# Patient Record
Sex: Female | Born: 1955 | Race: Black or African American | Hispanic: No | Marital: Married | Smoking: Former smoker
Health system: Southern US, Community
[De-identification: ages and names within clinical notes are randomized; demographics above are authoritative.]

## PROBLEM LIST (undated history)

## (undated) DIAGNOSIS — R531 Weakness: Secondary | ICD-10-CM

## (undated) DIAGNOSIS — M3501 Sicca syndrome with keratoconjunctivitis: Secondary | ICD-10-CM

## (undated) DIAGNOSIS — G8929 Other chronic pain: Secondary | ICD-10-CM

## (undated) DIAGNOSIS — E1121 Type 2 diabetes mellitus with diabetic nephropathy: Secondary | ICD-10-CM

## (undated) DIAGNOSIS — Z8669 Personal history of other diseases of the nervous system and sense organs: Secondary | ICD-10-CM

## (undated) DIAGNOSIS — M179 Osteoarthritis of knee, unspecified: Secondary | ICD-10-CM

## (undated) DIAGNOSIS — F329 Major depressive disorder, single episode, unspecified: Secondary | ICD-10-CM

## (undated) DIAGNOSIS — H169 Unspecified keratitis: Secondary | ICD-10-CM

## (undated) DIAGNOSIS — E7800 Pure hypercholesterolemia, unspecified: Secondary | ICD-10-CM

## (undated) DIAGNOSIS — I10 Essential (primary) hypertension: Secondary | ICD-10-CM

## (undated) DIAGNOSIS — E559 Vitamin D deficiency, unspecified: Secondary | ICD-10-CM

## (undated) DIAGNOSIS — M4722 Other spondylosis with radiculopathy, cervical region: Secondary | ICD-10-CM

## (undated) DIAGNOSIS — M1710 Unilateral primary osteoarthritis, unspecified knee: Secondary | ICD-10-CM

## (undated) DIAGNOSIS — H109 Unspecified conjunctivitis: Secondary | ICD-10-CM

## (undated) DIAGNOSIS — J320 Chronic maxillary sinusitis: Secondary | ICD-10-CM

## (undated) DIAGNOSIS — K219 Gastro-esophageal reflux disease without esophagitis: Secondary | ICD-10-CM

## (undated) DIAGNOSIS — M797 Fibromyalgia: Secondary | ICD-10-CM

## (undated) DIAGNOSIS — J309 Allergic rhinitis, unspecified: Secondary | ICD-10-CM

## (undated) HISTORY — DX: Pure hypercholesterolemia, unspecified: E78.00

## (undated) HISTORY — DX: Weakness: R53.1

## (undated) HISTORY — DX: Unspecified keratitis: H16.9

## (undated) HISTORY — DX: Other spondylosis with radiculopathy, cervical region: M47.22

## (undated) HISTORY — DX: Fibromyalgia: M79.7

## (undated) HISTORY — DX: Gastro-esophageal reflux disease without esophagitis: K21.9

## (undated) HISTORY — DX: Type 2 diabetes mellitus with diabetic nephropathy: E11.21

## (undated) HISTORY — DX: Osteoarthritis of knee, unspecified: M17.9

## (undated) HISTORY — DX: Other chronic pain: G89.29

## (undated) HISTORY — PX: CYST REMOVAL HAND: SHX6279

## (undated) HISTORY — DX: Allergic rhinitis, unspecified: J30.9

## (undated) HISTORY — DX: Sjogren syndrome with keratoconjunctivitis: M35.01

## (undated) HISTORY — PX: TUBAL LIGATION: SHX77

## (undated) HISTORY — DX: Vitamin D deficiency, unspecified: E55.9

## (undated) HISTORY — DX: Personal history of other diseases of the nervous system and sense organs: Z86.69

## (undated) HISTORY — DX: Unilateral primary osteoarthritis, unspecified knee: M17.10

## (undated) HISTORY — DX: Chronic maxillary sinusitis: J32.0

## (undated) HISTORY — DX: Morbid (severe) obesity due to excess calories: E66.01

## (undated) HISTORY — DX: Major depressive disorder, single episode, unspecified: F32.9

## (undated) HISTORY — DX: Unspecified conjunctivitis: H10.9

## (undated) HISTORY — DX: Essential (primary) hypertension: I10

---

## 2015-03-29 ENCOUNTER — Ambulatory Visit (HOSPITAL_BASED_OUTPATIENT_CLINIC_OR_DEPARTMENT_OTHER): Payer: Medicare (Managed Care) | Attending: Family Medicine

## 2015-03-29 VITALS — Ht 66.0 in | Wt 238.0 lb

## 2015-03-29 DIAGNOSIS — G4733 Obstructive sleep apnea (adult) (pediatric): Secondary | ICD-10-CM | POA: Diagnosis present

## 2015-04-06 DIAGNOSIS — G4733 Obstructive sleep apnea (adult) (pediatric): Secondary | ICD-10-CM | POA: Diagnosis not present

## 2015-04-06 NOTE — Sleep Study (Signed)
   NAME: Samantha Levine DATE OF BIRTH:  1956/01/22 MEDICAL RECORD NUMBER [id number]  LOCATION: [facility]  PHYSICIAN: YOUNG,CLINTON D  DATE OF STUDY: 03/29/2015  SLEEP STUDY TYPE: Nocturnal Polysomnogram               REFERRING PHYSICIAN: Jethro BastosKoehler, Robert N, MD  INDICATION FOR STUDY: Hypersomnia with sleep apnea-CPAP titration  EPWORTH SLEEPINESS SCORE:   15/24 HEIGHT: 5\' 6"  (167.6 cm)  WEIGHT: 238 lb (107.956 kg)    Body mass index is 38.43 kg/(m^2).  NECK SIZE: 17 in.  MEDICATIONS: Charted for review  SLEEP ARCHITECTURE: Total sleep time 348 minutes with sleep efficiency 91.5%. Stage I was 2.3%, stage II 75%, stage III absent, REM 22.7% of total sleep time. Sleep latency 4.5 minutes, REM latency 98.5 minutes, awake after sleep onset 27.5 minutes, arousal index 1.9, bedtime medication: Metformin, aspirin, alprazolam  RESPIRATORY DATA: CPAP titration protocol. CPAP was titrated to 9 CWP, AHI 0 per hour, based on snoring, respiratory events and arousals. She wore a small fullface mask.  OXYGEN DATA: Mild to moderate snoring at the beginning of this study was prevented at final CPAP pressure with mean oxygen saturation 91.2% on room air  CARDIAC DATA: Normal sinus rhythm  MOVEMENT/PARASOMNIA: No significant movement disturbance, no bathroom trips  IMPRESSION/ RECOMMENDATION:   1) CPAP titration to 9 CWP, AHI 0 per hour, based on snoring, respiratory events and arousals. She wore a small F&P Simplus fullface mask with heated humidifier. Snoring was prevented and mean oxygen saturation was 91.2% on room air 2) Results of a baseline polysomnogram are not available.   Waymon BudgeYOUNG,CLINTON D Diplomate, American Board of Sleep Medicine  ELECTRONICALLY SIGNED ON:  04/06/2015, 3:15 PM [facility] PH: [phone]   FX: [phone] ACCREDITED BY THE AMERICAN ACADEMY OF SLEEP MEDICINE

## 2015-04-17 ENCOUNTER — Ambulatory Visit (HOSPITAL_BASED_OUTPATIENT_CLINIC_OR_DEPARTMENT_OTHER): Payer: Medicare (Managed Care)

## 2015-04-24 ENCOUNTER — Encounter (HOSPITAL_BASED_OUTPATIENT_CLINIC_OR_DEPARTMENT_OTHER): Payer: Medicare (Managed Care)

## 2015-05-13 ENCOUNTER — Ambulatory Visit (HOSPITAL_BASED_OUTPATIENT_CLINIC_OR_DEPARTMENT_OTHER): Payer: Medicare (Managed Care) | Attending: Family Medicine | Admitting: Radiology

## 2015-05-13 VITALS — Ht 66.0 in | Wt 240.0 lb

## 2015-05-13 DIAGNOSIS — G4719 Other hypersomnia: Secondary | ICD-10-CM

## 2015-05-13 DIAGNOSIS — R5383 Other fatigue: Secondary | ICD-10-CM

## 2015-05-13 DIAGNOSIS — F32A Depression, unspecified: Secondary | ICD-10-CM

## 2015-05-13 DIAGNOSIS — F329 Major depressive disorder, single episode, unspecified: Secondary | ICD-10-CM

## 2015-05-24 DIAGNOSIS — R5383 Other fatigue: Secondary | ICD-10-CM | POA: Diagnosis not present

## 2015-05-24 DIAGNOSIS — G4719 Other hypersomnia: Secondary | ICD-10-CM | POA: Diagnosis not present

## 2015-05-24 NOTE — Progress Notes (Signed)
   NAME: Samantha Levine DATE OF BIRTH:  08-12-1956 MEDICAL RECORD NUMBER [id number]  LOCATION: [facility]  PHYSICIAN: [place] Frieden D  DATE OF STUDY: 05/13/2015  SLEEP STUDY TYPE: Nocturnal Polysomnogram               REFERRING PHYSICIAN: Jethro BastosKoehler, Robert N, MD  INDICATION FOR STUDY: Hypersomnia with sleep apnea  EPWORTH SLEEPINESS SCORE:   15/24   HEIGHT: 5\' 6"  (167.6 cm)  WEIGHT: 240 lb (108.863 kg)    Body mass index is 38.76 kg/(m^2).  NECK SIZE: 17 in.  MEDICATIONS: Charted for review  SLEEP ARCHITECTURE: Total sleep time 296.5 minutes with sleep efficiency 80.5%. Stage I was 12.6%, stage II 60.5%, stage III absent, REM 26.8% of total sleep time. Sleep latency 0.0 minutes, REM latency 60.5 minutes, awake after sleep onset 72 minutes, arousal index 14.8, bedtime medication: None  RESPIRATORY DATA: Apnea hypopnea index (AHI) 2.2 per hour. 11 events were scored including one obstructive apnea and 10 hypopneas. All events were while supine. REM AHI 3.8 per hour.  OXYGEN DATA: Very loud snoring with oxygen desaturation to a nadir of 83% and mean saturation 92.6% on room air  CARDIAC DATA: Sinus rhythm with dropped beats, probably blocked PACs.  MOVEMENT/PARASOMNIA: No significant movement disturbance, no bathroom trips  IMPRESSION/ RECOMMENDATION:   1) Occasional respiratory event with sleep disturbance, within normal limits. AHI 2.2 per hour with all events recorded while sleeping supine. REM AHI 3.8 per hour. Very loud snoring with oxygen desaturation to a nadir of 83% and mean saturation 92.6% on room air.  2) Sleep architecture did not seem to explain a persistent complaint of daytime sleepiness but should be compared with patient's usual sleep habit. For this study she was asleep by 10 PM and awake for an hour between 3 AM and 4 AM REM sleep developed earlier than usual and representative somewhat higher percentage of total sleep time and usually seen. This  pattern can occasionally be associated with primary disorders of excessive somnolence such as narcolepsy. If inappropriate daytime sleepiness remains a significant concern, then consider scheduling a Multiple Sleep Latency Test as a daytime study for evaluation of hypersomnolence.  Waymon BudgeYOUNG,Arantxa Piercey D Diplomate, American Board of Sleep Medicine  ELECTRONICALLY SIGNED ON:  05/24/2015, 3:58 PM [facility] PH: [phone]   FX: [phone] ACCREDITED BY THE AMERICAN ACADEMY OF SLEEP MEDICINE

## 2016-02-04 ENCOUNTER — Encounter: Payer: Self-pay | Admitting: Physician Assistant

## 2016-09-22 ENCOUNTER — Encounter: Payer: Self-pay | Admitting: Physician Assistant

## 2016-09-28 ENCOUNTER — Ambulatory Visit: Payer: Medicare (Managed Care) | Admitting: Physician Assistant

## 2016-09-28 NOTE — Progress Notes (Signed)
Cardiology Office Note    Date:  09/29/2016   ID:  Samantha PeanKimberly Shull, DOB 03/17/1956, MRN [id number]  PCP:  Thane EduKOEHLER,ROBERT NICHOLAS, MD  Cardiologist:  New   Chief Complaint: Cardiac Clearance for left maxillary sinus surgery  History of Present Illness:   Samantha Levine is a 60 y.o. female HTN, HLD, DM with peripheral neuropathy, GERD, fibromyalgia, depression, OSA not on CPAP and chronic left maxillary sinusitis possibly associated with dental abscess who was evaluated by ENT and plan for inpatinet surgery due to OSA requiring CPAP thus presented for cardiac clearance.   Hx of prior abnormal stress test about 20 years ago. F/u cath was normal. Echo about 2-3 years ago was normal with EF of 60% per patient. No records available for review.  The patient had chronic abdominal pain. Had EGD and colonoscopy about 3 months ago which showed diverticulosis. Adjusted diet and s/s improved.   Lab work 02/04/2016 from PCP:  HGb 13.5, SCr 0.74, Na 142, K 4.1, AST 12, ALT 12 LDL 126, HLD 53, Triglyceride 170,  Hemoglobin A1c 7.1 TSH 0.588  Here today for cardiac clearance. She states that she has OSA however never placed on CPAP. She complains of intermittent chest tightness with and without exertional. Also has chronic orthopnea and PND. She uses approximately 5-7 pillows at night. Recently noted LE edema. Denies abdominal tightness, syncope, dizziness. She been not checking her BP at home. Remote hx of tobacco smoking during college years. No recent travel.   Past Medical History:  Diagnosis Date  . Allergic rhinitis   . Cervical spondylosis with radiculopathy   . Chronic pain   . Conjunctivitis   . Fibromyalgia   . GERD (gastroesophageal reflux disease)   . H/O peripheral neuropathy   . Hypercholesterolemia   . Hypertension   . Keratitis sicca   . Left maxillary sinusitis    RELATED TO DENTAL ISSUES  . Left-sided weakness    POSSIBLE RELATED TO CONVERSION DISORDER  . Major  depression    RECURRENT AND SEVERE WITH GRIEF REACTION AND EPISODES OF CONVERSION REACTION MANIFESTED BY LEFT-SIDED WEAKNESS AND LOSS OF SENSATION  . Morbid obesity (HCC)   . Osteoarthritis of knee    SPINE, ANKLES AND FEET  . Type 2 diabetes mellitus with diabetic nephropathy (HCC)   . Vitamin D deficiency     Past Surgical History:  Procedure Laterality Date  . CESAREAN SECTION    . CYST REMOVAL HAND     x2  . TUBAL LIGATION      Current Medications:  Prior to Admission medications   Medication Sig Start Date End Date Taking? Authorizing Provider  acetaminophen (TYLENOL) 500 MG tablet Take 1,000 mg by mouth 2 (two) times daily as needed.   Yes Historical Provider, MD  albuterol (PROVENTIL HFA;VENTOLIN HFA) 108 (90 Base) MCG/ACT inhaler Inhale 2 puffs into the lungs 4 (four) times daily as needed for wheezing or shortness of breath.   Yes Historical Provider, MD  ALPRAZolam Prudy Feeler(XANAX) 0.5 MG tablet Take 0.5 mg by mouth 2 (two) times daily as needed for anxiety.   Yes Historical Provider, MD  amitriptyline (ELAVIL) 25 MG tablet Take 75 mg by mouth at bedtime.   Yes Historical Provider, MD  amLODipine (NORVASC) 10 MG tablet Take 10 mg by mouth daily.   Yes Historical Provider, MD  aspirin EC 81 MG tablet Take 81 mg by mouth daily.   Yes Historical Provider, MD  atorvastatin (LIPITOR) 40 MG tablet Take 40 mg by  mouth daily at 6 PM.   Yes Historical Provider, MD  Cholecalciferol (VITAMIN D3) 1000 units CAPS Take 1 capsule by mouth daily.   Yes Historical Provider, MD  cycloSPORINE (RESTASIS) 0.05 % ophthalmic emulsion Place 1 drop into both eyes daily. DRY EYES   Yes Historical Provider, MD  fluticasone (FLONASE) 50 MCG/ACT nasal spray Place 1 spray into both nostrils daily.   Yes Historical Provider, MD  hydrochlorothiazide (HYDRODIURIL) 25 MG tablet Take 25 mg by mouth 2 (two) times daily as needed.   Yes Historical Provider, MD  Hypromellose (ARTIFICIAL TEARS OP) Apply 1 drop to eye  every 2 (two) hours as needed. FOR DRY EYES   Yes Historical Provider, MD  ibuprofen (ADVIL,MOTRIN) 800 MG tablet Take 800 mg by mouth 2 (two) times daily.   Yes Historical Provider, MD  losartan (COZAAR) 50 MG tablet Take 50 mg by mouth daily.   Yes Historical Provider, MD  metFORMIN (GLUCOPHAGE) 500 MG tablet Take by mouth 2 (two) times daily with a meal.   Yes Historical Provider, MD  Olopatadine HCl (PATADAY) 0.2 % SOLN Apply 1 drop to eye daily as needed.   Yes Historical Provider, MD  pantoprazole (PROTONIX) 40 MG tablet Take 40 mg by mouth 2 (two) times daily.   Yes Historical Provider, MD  polyethylene glycol powder (MIRALAX) powder Take 1 Container by mouth daily.   Yes Historical Provider, MD     Allergies:   Tramadol; Augmentin [amoxicillin-pot clavulanate]; Codeine; Doxycycline; Lisinopril; and Darvon [propoxyphene]   Social History   Social History  . Marital status: Married    Spouse name: N/A  . Number of children: 5  . Years of education: COLLEGE   Occupational History  . DISABLED    Social History Main Topics  . Smoking status: Former Games developermoker  . Smokeless tobacco: Never Used  . Alcohol use No  . Drug use: No  . Sexual activity: Not Asked   Other Topics Concern  . None   Social History Narrative  . None     Family History:  The patient's family history includes Congestive Heart Failure in her brother and father.  ROS:   Please see the history of present illness.    ROS All other systems reviewed and are negative.   PHYSICAL EXAM:   VS:  BP (!) 142/90 (BP Location: Right Arm, Patient Position: Sitting, Cuff Size: Large)   Pulse 62   Ht 5\' 6"  (1.676 m)   Wt 243 lb 6.4 oz (110.4 kg)   SpO2 98%   BMI 39.29 kg/m    GEN: Well nourished, well developed, in no acute distress  HEENT: normal  Neck: + JVD, no carotid bruits, or masses Cardiac: RRR; no murmurs, rubs, or gallops 2+ BL LE  edema  Respiratory:  clear to auscultation bilaterally, normal work of  breathing GI: soft, nontender, nondistended, + BS MS: no deformity or atrophy  Skin: warm and dry, no rash Neuro:  Alert and Oriented x 3, Strength and sensation are intact Psych: Depressed , full affect  Wt Readings from Last 3 Encounters:  09/29/16 243 lb 6.4 oz (110.4 kg)  05/13/15 240 lb (108.9 kg)  03/29/15 238 lb (108 kg)      Studies/Labs Reviewed:   EKG:  EKG is ordered today.  The ekg ordered today demonstrates  NSR at rate of 52 bpm, chronic RBBB  Recent Labs: No results found for requested labs within last 8760 hours.   Lipid Panel No results found  for: CHOL, TRIG, HDL, CHOLHDL, VLDL, LDLCALC, LDLDIRECT  Additional studies/ records that were reviewed today include:   AS above    ASSESSMENT & PLAN:    1. Cardiac clearance - She has chronic intermittent chest tightness and dyspnea. Her cardiac risk factors includes HTN, HLD, DM, untreated OSA and prior false positive stress test (result unavailable). She has evidence of volume overload on exam. Discussed with DOD (Dr. Ladona Ridgel) and recommended BNP, echo and Stress test. F/u with Dr. Tenny Craw in 3 months. Sooner with APP if abnormal study. Continue ASA.   2. OSA - She said that she never placed on CPAP. Her orthopnea/ PND could be due to this. Will defer to primary.   3. LE edema - Can not r/u if due to CHF. Will get BNP and echo. May consider discontinuation of amlodipine in future if no improvement.   4. HTN - Minimally elevated. Continue Amlodipine 10mg  qd and Losartan 50mg  qd. She takes HTCZ 50mg  PRN for dyspnea and LE edema. Likely put on daily dose pending BNP result.   5. HLD - LDL 126. Continue ASA. Managed by PCP.   6. Sinus bradycardia - Not on any AV blocking agent. No dizziness or orthopnea.     Medication Adjustments/Labs and Tests Ordered: Current medicines are reviewed at length with the patient today.  Concerns regarding medicines are outlined above.  Medication changes, Labs and Tests ordered  today are listed in the Patient Instructions below. There are no Patient Instructions on file for this visit.   Lorelei Pont, [place]  09/29/2016 10:22 AM    [facility] [address] [place], [place], [place]  [address] Phone: [phone]; Fax: [phone]

## 2016-09-29 ENCOUNTER — Encounter: Payer: Self-pay | Admitting: Physician Assistant

## 2016-09-29 ENCOUNTER — Ambulatory Visit (INDEPENDENT_AMBULATORY_CARE_PROVIDER_SITE_OTHER): Payer: Medicare (Managed Care) | Admitting: Physician Assistant

## 2016-09-29 VITALS — BP 142/90 | HR 62 | Ht 66.0 in | Wt 243.4 lb

## 2016-09-29 DIAGNOSIS — G4733 Obstructive sleep apnea (adult) (pediatric): Secondary | ICD-10-CM

## 2016-09-29 DIAGNOSIS — I10 Essential (primary) hypertension: Secondary | ICD-10-CM | POA: Diagnosis not present

## 2016-09-29 DIAGNOSIS — R001 Bradycardia, unspecified: Secondary | ICD-10-CM

## 2016-09-29 DIAGNOSIS — R079 Chest pain, unspecified: Secondary | ICD-10-CM | POA: Diagnosis not present

## 2016-09-29 DIAGNOSIS — Z01818 Encounter for other preprocedural examination: Secondary | ICD-10-CM | POA: Diagnosis not present

## 2016-09-29 DIAGNOSIS — R0600 Dyspnea, unspecified: Secondary | ICD-10-CM

## 2016-09-29 DIAGNOSIS — E785 Hyperlipidemia, unspecified: Secondary | ICD-10-CM

## 2016-09-29 LAB — BRAIN NATRIURETIC PEPTIDE: BRAIN NATRIURETIC PEPTIDE: 48.3 pg/mL (ref <100)

## 2016-09-29 NOTE — Patient Instructions (Signed)
Medication Instructions:  Your physician recommends that you continue on your current medications as directed. Please refer to the Current Medication list given to you today.  Labwork: Today: BNP  Testing/Procedures: Your physician has requested that you have a lexiscan myoview. For further information please visit [email or web address] Please follow instruction sheet, as given.  Your physician has requested that you have an echocardiogram. Echocardiography is a painless test that uses sound waves to create images of your heart. It provides your doctor with information about the size and shape of your heart and how well your heart's chambers and valves are working. This procedure takes approximately one hour. There are no restrictions for this procedure.  Follow-Up: Your physician recommends that you schedule an appointment to establish with Dr. Tenny Craw in: 3 months.  (this may change depending on results of testing)  If you need a refill on your cardiac medications before your next appointment, please call your pharmacy.  Thank you for choosing [facility]!!     Any Other Special Instructions Will Be Listed Below (If Applicable).  Echocardiogram An echocardiogram, or echocardiography, uses sound waves (ultrasound) to produce an image of your heart. The echocardiogram is simple, painless, obtained within a short period of time, and offers valuable information to your health care provider. The images from an echocardiogram can provide information such as:  Evidence of coronary artery disease (CAD).  Heart size.  Heart muscle function.  Heart valve function.  Aneurysm detection.  Evidence of a past heart attack.  Fluid buildup around the heart.  Heart muscle thickening.  Assess heart valve function. LET [facility] CARE PROVIDER KNOW ABOUT:  Any allergies you have.  All medicines you are taking, including vitamins, herbs, eye drops, creams, and over-the-counter  medicines.  Previous problems you or members of your family have had with the use of anesthetics.  Any blood disorders you have.  Previous surgeries you have had.  Medical conditions you have.  Possibility of pregnancy, if this applies. BEFORE THE PROCEDURE  No special preparation is needed. Eat and drink normally.  PROCEDURE   In order to produce an image of your heart, gel will be applied to your chest and a wand-like tool (transducer) will be moved over your chest. The gel will help transmit the sound waves from the transducer. The sound waves will harmlessly bounce off your heart to allow the heart images to be captured in real-time motion. These images will then be recorded.  You may need an IV to receive a medicine that improves the quality of the pictures. AFTER THE PROCEDURE You may return to your normal schedule including diet, activities, and medicines, unless your health care provider tells you otherwise.   This information is not intended to replace advice given to you by your health care provider. Make sure you discuss any questions you have with your health care provider.   Document Released: 11/06/2000 Document Revised: 11/30/2014 Document Reviewed: 07/17/2013 Elsevier Interactive Patient Education 2016 Elsevier Inc.    Pharmacologic Stress Electrocardiogram A pharmacologic stress electrocardiogram is a heart (cardiac) test that uses nuclear imaging to evaluate the blood supply to your heart. This test may also be called a pharmacologic stress electrocardiography. Pharmacologic means that a medicine is used to increase your heart rate and blood pressure.  This stress test is done to find areas of poor blood flow to the heart by determining the extent of coronary artery disease (CAD). Some people exercise on a treadmill, which naturally increases the blood flow  to the heart. For those people unable to exercise on a treadmill, a medicine is used. This medicine stimulates  your heart and will cause your heart to beat harder and more quickly, as if you were exercising.  Pharmacologic stress tests can help determine:  The adequacy of blood flow to your heart during increased levels of activity in order to clear you for discharge home.  The extent of coronary artery blockage caused by CAD.  Your prognosis if you have suffered a heart attack.  The effectiveness of cardiac procedures done, such as an angioplasty, which can increase the circulation in your coronary arteries.  Causes of chest pain or pressure. LET [facility] PROVIDER KNOW ABOUT:  Any allergies you have.  All medicines you are taking, including vitamins, herbs, eye drops, creams, and over-the-counter medicines.  Previous problems you or members of your family have had with the use of anesthetics.  Any blood disorders you have.  Previous surgeries you have had.  Medical conditions you have.  Possibility of pregnancy, if this applies.  If you are currently breastfeeding. RISKS AND COMPLICATIONS Generally, this is a safe procedure. However, as with any procedure, complications can occur. Possible complications include:  You develop pain or pressure in the following areas:  Chest.  Jaw or neck.  Between your shoulder blades.  Radiating down your left arm.  Headache.  Dizziness or light-headedness.  Shortness of breath.  Increased or irregular heartbeat.  Low blood pressure.  Nausea or vomiting.  Flushing.  Redness going up the arm and slight pain during injection of medicine.  Heart attack (rare). BEFORE THE PROCEDURE   Avoid all forms of caffeine for 24 hours before your test or as directed by your health care provider. This includes coffee, tea (even decaffeinated tea), caffeinated sodas, chocolate, cocoa, and certain pain medicines.  Follow your health care provider's instructions regarding eating and drinking before the test.  Take your medicines as  directed at regular times with water unless instructed otherwise. Exceptions may include:  If you have diabetes, ask how you are to take your insulin or pills. It is common to adjust insulin dosing the morning of the test.  If you are taking beta-blocker medicines, it is important to talk to your health care provider about these medicines well before the date of your test. Taking beta-blocker medicines may interfere with the test. In some cases, these medicines need to be changed or stopped 24 hours or more before the test.  If you wear a nitroglycerin patch, it may need to be removed prior to the test. Ask your health care provider if the patch should be removed before the test.  If you use an inhaler for any breathing condition, bring it with you to the test.  If you are an outpatient, bring a snack so you can eat right after the stress phase of the test.  Do not smoke for 4 hours prior to the test or as directed by your health care provider.  Do not apply lotions, powders, creams, or oils on your chest prior to the test.  Wear comfortable shoes and clothing. Let your health care provider know if you were unable to complete or follow the preparations for your test. PROCEDURE   Multiple patches (electrodes) will be put on your chest. If needed, small areas of your chest may be shaved to get better contact with the electrodes. Once the electrodes are attached to your body, multiple wires will be attached to the  electrodes, and your heart rate will be monitored.  An IV access will be started. A nuclear trace (isotope) is given. The isotope may be given intravenously, or it may be swallowed. Nuclear refers to several types of radioactive isotopes, and the nuclear isotope lights up the arteries so that the nuclear images are clear. The isotope is absorbed by your body. This results in low radiation exposure.  A resting nuclear image is taken to show how your heart functions at rest.  A  medicine is given through the IV access.  A second scan is done about 1 hour after the medicine injection and determines how your heart functions under stress.  During this stress phase, you will be connected to an electrocardiogram machine. Your blood pressure and oxygen levels will be monitored. AFTER THE PROCEDURE   Your heart rate and blood pressure will be monitored after the test.  You may return to your normal schedule, including diet,activities, and medicines, unless your health care provider tells you otherwise.   This information is not intended to replace advice given to you by your health care provider. Make sure you discuss any questions you have with your health care provider.   Document Released: 03/28/2009 Document Revised: 11/14/2013 Document Reviewed: 07/17/2013 Elsevier Interactive Patient Education [employer]2016 Elsevier Inc.

## 2016-10-01 ENCOUNTER — Telehealth: Payer: Self-pay | Admitting: Physician Assistant

## 2016-10-01 NOTE — Telephone Encounter (Signed)
New message ° ° ° ° ° ° °Returning a call to get test results °

## 2016-10-01 NOTE — Telephone Encounter (Signed)
Returned pts call and discussed her lab results.

## 2016-10-06 ENCOUNTER — Telehealth: Payer: Self-pay | Admitting: *Deleted

## 2016-10-06 DIAGNOSIS — I10 Essential (primary) hypertension: Secondary | ICD-10-CM

## 2016-10-06 MED ORDER — LOSARTAN POTASSIUM 100 MG PO TABS: 50.0000 mg | ORAL_TABLET | Freq: Every day | ORAL | 1 refills | Status: DC

## 2016-10-06 MED ORDER — LOSARTAN POTASSIUM 100 MG PO TABS: 100.0000 mg | ORAL_TABLET | Freq: Every day | ORAL | 1 refills | Status: AC

## 2016-10-06 NOTE — Addendum Note (Signed)
Addended by: Burnetta SabinWITTY, Demitrious Mccannon K on: 10/06/2016 09:19 AM   Modules accepted: Orders

## 2016-10-06 NOTE — Telephone Encounter (Signed)
Pt has been made aware to d/c hctz, increase losartan to 100 mg qd and come in office 10-09-16 for repeat bmet and mag.  Orders in EPIC.

## 2016-10-06 NOTE — Addendum Note (Signed)
Addended by: Burnetta SabinWITTY, Toree Edling K on: 10/06/2016 09:21 AM   Modules accepted: Orders

## 2016-10-06 NOTE — Telephone Encounter (Signed)
-----   Message from HerlongBhavinkumar Bhagat, [place]PA sent at 10/01/2016 10:18 AM EST ----- Seems her muscle cramps only occurs when she takes HCTZ. Thus likely not related to statin. Will discontinue HCTZ. Increase losartan to 100mg  qd. BMET with magnesium in week.     ----- Message ----- From: Elliot CousinJennifer Railynn Ballo, RMA Sent: 10/01/2016   9:49 AM To: Manson PasseyBhavinkumar Bhagat, PA  Pt has been made aware of her lab results. She is already on HCTZ 2x's weekly. Pt is ok with starting q d but she is concerned about the cramps. She said they are so bad when she takes HCTZ. Can you recommend anything for that?  She states the pain gets so bad.Marland Kitchen..Marland Kitchen

## 2016-10-09 ENCOUNTER — Other Ambulatory Visit: Payer: Medicare (Managed Care)

## 2016-10-19 ENCOUNTER — Telehealth (HOSPITAL_COMMUNITY): Payer: Self-pay | Admitting: *Deleted

## 2016-10-19 NOTE — Telephone Encounter (Signed)
Left message on voicemail per DPR in reference to upcoming appointment scheduled on 10/21/16 at 1000 with detailed instructions given per Myocardial Perfusion Study Information Sheet for the test. LM to arrive 15 minutes early, and that it is imperative to arrive on time for appointment to keep from having the test rescheduled. If you need to cancel or reschedule your appointment, please call the office within 24 hours of your appointment. Failure to do so may result in a cancellation of your appointment, and a $50 no show fee. Phone number given for call back for any questions.

## 2016-10-21 ENCOUNTER — Encounter (HOSPITAL_COMMUNITY): Payer: Medicare (Managed Care)

## 2016-10-21 ENCOUNTER — Other Ambulatory Visit (HOSPITAL_COMMUNITY): Payer: Medicare (Managed Care)

## 2016-10-29 ENCOUNTER — Encounter: Payer: Self-pay | Admitting: Internal Medicine

## 2016-12-22 ENCOUNTER — Ambulatory Visit (INDEPENDENT_AMBULATORY_CARE_PROVIDER_SITE_OTHER): Payer: Medicare (Managed Care) | Admitting: Orthopedic Surgery

## 2016-12-22 DIAGNOSIS — M7662 Achilles tendinitis, left leg: Secondary | ICD-10-CM | POA: Diagnosis not present

## 2016-12-22 MED ORDER — IBUPROFEN 800 MG PO TABS: 800.0000 mg | ORAL_TABLET | Freq: Two times a day (BID) | ORAL | 0 refills | Status: DC

## 2016-12-22 NOTE — Progress Notes (Signed)
Office Visit Note   Patient: Samantha Levine           Date of Birth: September 19, 1956           MRN: [id number] Visit Date: 12/22/2016              Requested by: Jethro Bastosobert N Koehler, MD [address] [place], [place] [address] PCP: Thane EduKOEHLER,ROBERT NICHOLAS, MD  No chief complaint on file.   HPI: The patient is a 61 year old woman who is seen today for evaluation of pain in the left heel. No known injury. Was recently evaluated in the emergency room for multiple complaints did have radiographs of the heel which showed heel spurs. She is hoping to get these surgically excised.   Complains of ongoing pain for the last 2 months. Pain with ambulation. Today presents in Toms high heel wedges states these are her most comfortable shoes. She has pain when wearing flat shoe wear or trying to walk barefoot. Been taking ibuprofen 800 mg with minimal relief.    Assessment & Plan: Visit Diagnoses:  1. Achilles tendinitis of left lower extremity     Plan: We'll place her in a Cam Walker today with 2 heel lifts. She will rest the Achilles. Continue with ibuprofen 800 mg twice daily for inflammation. She'll follow up in a few weeks at which point we will begin her on heel cord stretching.  Follow-Up Instructions: Return in about 3 weeks (around 01/12/2017).   Ortho Exam Physical Exam  Constitutional: Appears well-developed.  Head: Normocephalic.  Eyes: EOM are normal.  Neck: Normal range of motion.  Cardiovascular: Normal rate.   Pulmonary/Chest: Effort normal.  Neurological: Is alert.  Skin: Skin is warm.  Psychiatric: Has a normal mood and affect. Left ankle and foot: Moderate swelling to ankle. Achilles is tender, maximally so at origin. There is no erythema. The ankle ligaments are nontender. Foot is plantigrade. Has dorsiflexion just shy of neutral, this is painful.  Imaging: No results found.  Orders:  No orders of the defined types were placed in this encounter.  Meds ordered this  encounter  Medications  . ibuprofen (ADVIL,MOTRIN) 800 MG tablet    Sig: Take 1 tablet (800 mg total) by mouth 2 (two) times daily.    Dispense:  60 tablet    Refill:  0     Procedures: No procedures performed  Clinical Data: No additional findings.  Subjective: Review of Systems  Constitutional: Negative for chills and fever.  Musculoskeletal: Positive for myalgias. Negative for gait problem.    Objective: Vital Signs: There were no vitals taken for this visit.  Specialty Comments:  No specialty comments available.  PMFS History: There are no active problems to display for this patient.  Past Medical History:  Diagnosis Date  . Allergic rhinitis   . Cervical spondylosis with radiculopathy   . Chronic pain   . Conjunctivitis   . Fibromyalgia   . GERD (gastroesophageal reflux disease)   . H/O peripheral neuropathy   . Hypercholesterolemia   . Hypertension   . Keratitis sicca   . Left maxillary sinusitis    RELATED TO DENTAL ISSUES  . Left-sided weakness    POSSIBLE RELATED TO CONVERSION DISORDER  . Major depression    RECURRENT AND SEVERE WITH GRIEF REACTION AND EPISODES OF CONVERSION REACTION MANIFESTED BY LEFT-SIDED WEAKNESS AND LOSS OF SENSATION  . Morbid obesity (HCC)   . Osteoarthritis of knee    SPINE, ANKLES AND FEET  . Type 2 diabetes  mellitus with diabetic nephropathy (HCC)   . Vitamin D deficiency     Family History  Problem Relation Age of Onset  . Congestive Heart Failure Father   . Congestive Heart Failure Brother     Past Surgical History:  Procedure Laterality Date  . CESAREAN SECTION    . CYST REMOVAL HAND     x2  . TUBAL LIGATION     Social History   Occupational History  . DISABLED    Social History Main Topics  . Smoking status: Former Games developer  . Smokeless tobacco: Never Used  . Alcohol use No  . Drug use: No  . Sexual activity: Not on file

## 2016-12-29 NOTE — Progress Notes (Deleted)
Cardiology Office Note   Date:  12/29/2016   ID:  Samantha PeanKimberly Meiring, DOB December 04, 1955, MRN [id number]  PCP:  Thane EduKOEHLER,ROBERT NICHOLAS, MD  Cardiologist:   Dietrich PatesPaula Kohner Orlick, MD       History of Present Illness: Samantha Levine is a 61 y.o. female with a history of HTN, edema, HL, DM with perip neuropathy, GERD, fibromyalgia, depression, OSA,   She was seen by Demetra ShinerV Bhagat in November   Cath in past normal  Echo LVEF normal   Chronic abdomial pain Seen for presurgery clearance in Novmeber  Echo and stress test ordered but not done        No outpatient prescriptions have been marked as taking for the 12/31/16 encounter (Appointment) with Pricilla RifflePaula Abhay Godbolt V, MD.     Allergies:   Tramadol; Augmentin [amoxicillin-pot clavulanate]; Codeine; Doxycycline; Lisinopril; and Darvon [propoxyphene]   Past Medical History:  Diagnosis Date  . Allergic rhinitis   . Cervical spondylosis with radiculopathy   . Chronic pain   . Conjunctivitis   . Fibromyalgia   . GERD (gastroesophageal reflux disease)   . H/O peripheral neuropathy   . Hypercholesterolemia   . Hypertension   . Keratitis sicca   . Left maxillary sinusitis    RELATED TO DENTAL ISSUES  . Left-sided weakness    POSSIBLE RELATED TO CONVERSION DISORDER  . Major depression    RECURRENT AND SEVERE WITH GRIEF REACTION AND EPISODES OF CONVERSION REACTION MANIFESTED BY LEFT-SIDED WEAKNESS AND LOSS OF SENSATION  . Morbid obesity (HCC)   . Osteoarthritis of knee    SPINE, ANKLES AND FEET  . Type 2 diabetes mellitus with diabetic nephropathy (HCC)   . Vitamin D deficiency     Past Surgical History:  Procedure Laterality Date  . CESAREAN SECTION    . CYST REMOVAL HAND     x2  . TUBAL LIGATION       Social History:  The patient  reports that she has quit smoking. She has never used smokeless tobacco. She reports that she does not drink alcohol or use drugs.   Family History:  The patient's family history includes Congestive Heart Failure in her  brother and father.    ROS:  Please see the history of present illness. All other systems are reviewed and  Negative to the above problem except as noted.    PHYSICAL EXAM: VS:  There were no vitals taken for this visit.  GEN: Well nourished, well developed, in no acute distress  HEENT: normal  Neck: no JVD, carotid bruits, or masses Cardiac: RRR; no murmurs, rubs, or gallops,no edema  Respiratory:  clear to auscultation bilaterally, normal work of breathing GI: soft, nontender, nondistended, + BS  No hepatomegaly  MS: no deformity Moving all extremities   Skin: warm and dry, no rash Neuro:  Strength and sensation are intact Psych: euthymic mood, full affect   EKG:  EKG is ordered today.   Lipid Panel No results found for: CHOL, TRIG, HDL, CHOLHDL, VLDL, LDLCALC, LDLDIRECT    Wt Readings from Last 3 Encounters:  09/29/16 243 lb 6.4 oz (110.4 kg)  05/13/15 240 lb (108.9 kg)  03/29/15 238 lb (108 kg)      ASSESSMENT AND PLAN:     Current medicines are reviewed at length with the patient today.  The patient does not have concerns regarding medicines.  Signed, Dietrich PatesPaula Shyanne Mcclary, MD  12/29/2016 2:33 PM    [facility] [address], [place], [place]  [address]  Phone: [phone]; Fax: [phone]

## 2016-12-31 ENCOUNTER — Ambulatory Visit: Payer: Medicare (Managed Care) | Admitting: Internal Medicine

## 2017-01-07 ENCOUNTER — Ambulatory Visit (INDEPENDENT_AMBULATORY_CARE_PROVIDER_SITE_OTHER): Payer: Medicare (Managed Care) | Admitting: Orthopedic Surgery

## 2017-01-13 ENCOUNTER — Encounter (INDEPENDENT_AMBULATORY_CARE_PROVIDER_SITE_OTHER): Payer: Self-pay | Admitting: Orthopedic Surgery

## 2017-01-13 ENCOUNTER — Ambulatory Visit (INDEPENDENT_AMBULATORY_CARE_PROVIDER_SITE_OTHER): Payer: Medicare (Managed Care) | Admitting: Orthopedic Surgery

## 2017-01-13 ENCOUNTER — Ambulatory Visit (INDEPENDENT_AMBULATORY_CARE_PROVIDER_SITE_OTHER): Payer: Medicare (Managed Care)

## 2017-01-13 DIAGNOSIS — M7661 Achilles tendinitis, right leg: Secondary | ICD-10-CM | POA: Diagnosis not present

## 2017-01-13 DIAGNOSIS — M7662 Achilles tendinitis, left leg: Secondary | ICD-10-CM | POA: Diagnosis not present

## 2017-01-13 MED ORDER — PREDNISONE 10 MG PO TABS: 20.0000 mg | ORAL_TABLET | Freq: Every day | ORAL | 0 refills | Status: AC

## 2017-01-13 NOTE — Progress Notes (Signed)
Office Visit Note   Patient: Samantha Levine           Date of Birth: 02-20-1956           MRN: [id number] Visit Date: 01/13/2017              Requested by: Jethro Bastos, MD [address] [place] [address] PCP: Thane Edu, MD  Chief Complaint  Patient presents with  . Left Ankle - Follow-up    Left achilles tendinitis    HPI: Patient is a 61 y.o woman who presents for follow up left leg achilles tendinitis. She is wearing cam walker with heel lift. She is now having pain in right heel and ball of foot. She has been trying her best to just minimize activities due to her pain. She has tried to make a lift for her right foot. She is having sleep disturbance due to pain in right foot. Donalee Citrin, RT    Assessment & Plan: Visit Diagnoses:  1. Achilles tendinitis, right leg   2. Achilles tendinitis, left leg     Plan: Patient also has Achilles tendinitis on the right at this time. She has failed treatment with anti-inflammatories including 800 mg ibuprofen. We'll start her on prednisone 20 mg every morning with breakfast and will place her in a fracture boot on the right with a heel lift on the right as well. Reevaluate in 4 weeks.  Follow-Up Instructions: Return in about 4 weeks (around 02/10/2017).   Ortho Exam Examination patient is alert oriented no adenopathy well-dressed normal affect normal respiratory effort. Examination she has a good dorsalis pedis pulse bilaterally. She has dorsiflexion to neutral bilaterally. She is tender to palpation along the Achilles tendon bilaterally but there is no nodular changes no skin changes.  Imaging: Xr Ankle 2 Views Right  Result Date: 01/13/2017 2 view radiographs of the right ankle shows a congruent tibial talar joint. She does have bony spurs at the insertion of the Achilles with calcification at the insertion of the Achilles.   Orders:  Orders Placed This Encounter  Procedures  . XR Ankle 2 Views  Right   No orders of the defined types were placed in this encounter.    Procedures: No procedures performed  Clinical Data: No additional findings.  Subjective: Review of Systems  Objective: Vital Signs: There were no vitals taken for this visit.  Specialty Comments:  No specialty comments available.  PMFS History: Patient Active Problem List   Diagnosis Date Noted  . Achilles tendinitis, left leg 01/13/2017  . Achilles tendinitis, right leg 01/13/2017   Past Medical History:  Diagnosis Date  . Allergic rhinitis   . Cervical spondylosis with radiculopathy   . Chronic pain   . Conjunctivitis   . Fibromyalgia   . GERD (gastroesophageal reflux disease)   . H/O peripheral neuropathy   . Hypercholesterolemia   . Hypertension   . Keratitis sicca   . Left maxillary sinusitis    RELATED TO DENTAL ISSUES  . Left-sided weakness    POSSIBLE RELATED TO CONVERSION DISORDER  . Major depression    RECURRENT AND SEVERE WITH GRIEF REACTION AND EPISODES OF CONVERSION REACTION MANIFESTED BY LEFT-SIDED WEAKNESS AND LOSS OF SENSATION  . Morbid obesity (HCC)   . Osteoarthritis of knee    SPINE, ANKLES AND FEET  . Type 2 diabetes mellitus with diabetic nephropathy (HCC)   . Vitamin D deficiency     Family History  Problem Relation Age  of Onset  . Congestive Heart Failure Father   . Congestive Heart Failure Brother     Past Surgical History:  Procedure Laterality Date  . CESAREAN SECTION    . CYST REMOVAL HAND     x2  . TUBAL LIGATION     Social History   Occupational History  . DISABLED    Social History Main Topics  . Smoking status: Former Games developer  . Smokeless tobacco: Never Used  . Alcohol use No  . Drug use: No  . Sexual activity: Not on file

## 2017-01-19 ENCOUNTER — Other Ambulatory Visit (HOSPITAL_COMMUNITY): Payer: Medicare (Managed Care)

## 2017-01-20 ENCOUNTER — Telehealth (HOSPITAL_COMMUNITY): Payer: Self-pay | Admitting: *Deleted

## 2017-01-20 NOTE — Telephone Encounter (Signed)
Patient given detailed instructions per Myocardial Perfusion Study Information Sheet for the test on 01/25/17. Patient notified to arrive 15 minutes early and that it is imperative to arrive on time for appointment to keep from having the test rescheduled.  If you need to cancel or reschedule your appointment, please call the office within 24 hours of your appointment. Failure to do so may result in a cancellation of your appointment, and a $50 no show fee. Patient verbalized understanding. Saralyn Willison Jacqueline    

## 2017-01-25 ENCOUNTER — Ambulatory Visit (HOSPITAL_COMMUNITY): Payer: Medicare (Managed Care)

## 2017-01-26 ENCOUNTER — Ambulatory Visit (HOSPITAL_COMMUNITY): Payer: Medicare (Managed Care)

## 2017-02-04 ENCOUNTER — Other Ambulatory Visit (HOSPITAL_COMMUNITY): Payer: Medicare (Managed Care)

## 2017-02-10 ENCOUNTER — Ambulatory Visit (INDEPENDENT_AMBULATORY_CARE_PROVIDER_SITE_OTHER): Payer: Medicare (Managed Care) | Admitting: Orthopedic Surgery

## 2017-02-17 ENCOUNTER — Telehealth (HOSPITAL_COMMUNITY): Payer: Self-pay | Admitting: Physician Assistant

## 2017-02-17 NOTE — Telephone Encounter (Signed)
Pt cancelled appt on 10/21/16 and no-showed on 01/27/17. Pt was also contacted on 11/10/16 to get rescheduled but patient voiced that they wanted to wait until the first of the year to be rescheduled.    11/10/2016 called pt to see if she was ready to reschedule, she asked we call back after the first of the year.lm  Pt will be removed from the workqueue.

## 2017-03-04 ENCOUNTER — Encounter (INDEPENDENT_AMBULATORY_CARE_PROVIDER_SITE_OTHER): Payer: Self-pay | Admitting: Orthopedic Surgery

## 2017-03-04 ENCOUNTER — Ambulatory Visit (INDEPENDENT_AMBULATORY_CARE_PROVIDER_SITE_OTHER): Payer: Medicare (Managed Care) | Admitting: Orthopedic Surgery

## 2017-03-04 ENCOUNTER — Ambulatory Visit (INDEPENDENT_AMBULATORY_CARE_PROVIDER_SITE_OTHER): Payer: Medicare (Managed Care) | Admitting: Family

## 2017-03-04 ENCOUNTER — Ambulatory Visit (INDEPENDENT_AMBULATORY_CARE_PROVIDER_SITE_OTHER): Payer: Medicare (Managed Care)

## 2017-03-04 VITALS — Ht 66.0 in | Wt 243.0 lb

## 2017-03-04 DIAGNOSIS — M542 Cervicalgia: Secondary | ICD-10-CM | POA: Diagnosis not present

## 2017-03-05 ENCOUNTER — Encounter (INDEPENDENT_AMBULATORY_CARE_PROVIDER_SITE_OTHER): Payer: Self-pay | Admitting: *Deleted

## 2017-03-08 NOTE — Progress Notes (Signed)
Office Visit Note   Patient: Samantha Levine           Date of Birth: 1955-12-05           MRN: [id number] Visit Date: 03/04/2017              Requested by: Jethro Bastos, MD [address], [place] [address] PCP: Thane Edu, MD  Chief Complaint  Patient presents with  . Neck - Pain      HPI: The patient is 61 year old woman who presents today complaining of left shoulder pain left-sided neck pain. States the pain radiates down to her elbow. This is been ongoing for about 2 weeks. States has been getting worse and worse as time goes by. She states her arm feels heavy. Complaining of a burning feeling radiating down her arm. Complaining of numbness and tingling in her elbow. States that she initially thought she was having a heart attack. Was evaluated for the same by her. States its painful to turn her neck side to side. Has had a hard time falling asleep due to the neck pain. Has used Biofreeze Aspercreme a heating pad ibuprofen for pain. States that ibuprofen is the only thing that eases it.  Assessment & Plan: Visit Diagnoses:  1. Neck pain   2. Cervicalgia     Plan: We will proceed with MRI of her cervical spine.  Follow-Up Instructions: Return for mri review.   Back Exam   Tenderness  The patient is experiencing tenderness in the cervical.  Range of Motion  Extension: normal  Flexion: normal   Comments:  Rotation of her cervical spine is painful. She does have pain with lateral flexion as well. Spurling's is negative.   Left Hand Exam   Muscle Strength  Grip:  3/5   Other  Pulse: present   Left Elbow Exam  Left elbow exam is normal.   Left Shoulder Exam   Tenderness  Left shoulder tenderness location: Global.  Tests  Drop Arm: negative Impingement: positive      Patient is alert, oriented, no adenopathy, well-dressed, normal affect, normal respiratory effort.   Imaging: No results found.  Labs: No results found  for: HGBA1C, ESRSEDRATE, CRP, LABURIC, REPTSTATUS, GRAMSTAIN, CULT, LABORGA  Orders:  Orders Placed This Encounter  Procedures  . XR Cervical Spine 2 or 3 views  . MR Cervical Spine w/o contrast   No orders of the defined types were placed in this encounter.    Procedures: No procedures performed  Clinical Data: No additional findings.  ROS:  All other systems negative, except as noted in the HPI. Review of Systems  Constitutional: Negative for chills and fever.  Musculoskeletal: Positive for arthralgias, myalgias and neck pain.  Neurological: Positive for weakness and numbness.    Objective: Vital Signs: Ht  (1.676 m)   Wt 243 lb (110.2 kg)   BMI 39.22 kg/m   Specialty Comments:  No specialty comments available.  PMFS History: Patient Active Problem List   Diagnosis Date Noted  . Achilles tendinitis, left leg 01/13/2017  . Achilles tendinitis, right leg 01/13/2017   Past Medical History:  Diagnosis Date  . Allergic rhinitis   . Cervical spondylosis with radiculopathy   . Chronic pain   . Conjunctivitis   . Fibromyalgia   . GERD (gastroesophageal reflux disease)   . H/O peripheral neuropathy   . Hypercholesterolemia   . Hypertension   . Keratitis sicca   . Left maxillary sinusitis  RELATED TO DENTAL ISSUES  . Left-sided weakness    POSSIBLE RELATED TO CONVERSION DISORDER  . Major depression    RECURRENT AND SEVERE WITH GRIEF REACTION AND EPISODES OF CONVERSION REACTION MANIFESTED BY LEFT-SIDED WEAKNESS AND LOSS OF SENSATION  . Morbid obesity (HCC)   . Osteoarthritis of knee    SPINE, ANKLES AND FEET  . Type 2 diabetes mellitus with diabetic nephropathy (HCC)   . Vitamin D deficiency     Family History  Problem Relation Age of Onset  . Congestive Heart Failure Father   . Congestive Heart Failure Brother     Past Surgical History:  Procedure Laterality Date  . CESAREAN SECTION    . CYST REMOVAL HAND     x2  . TUBAL LIGATION     Social  History   Occupational History  . DISABLED    Social History Main Topics  . Smoking status: Former Games developer  . Smokeless tobacco: Never Used  . Alcohol use No  . Drug use: No  . Sexual activity: Not on file

## 2017-03-15 ENCOUNTER — Ambulatory Visit (HOSPITAL_COMMUNITY): Admission: RE | Admit: 2017-03-15 | Payer: Medicare (Managed Care) | Source: Ambulatory Visit

## 2017-03-19 ENCOUNTER — Ambulatory Visit (INDEPENDENT_AMBULATORY_CARE_PROVIDER_SITE_OTHER): Payer: Medicare (Managed Care) | Admitting: Family

## 2017-03-22 ENCOUNTER — Ambulatory Visit (INDEPENDENT_AMBULATORY_CARE_PROVIDER_SITE_OTHER): Payer: Medicare (Managed Care) | Admitting: Orthopedic Surgery

## 2017-03-23 ENCOUNTER — Ambulatory Visit (HOSPITAL_COMMUNITY): Admission: RE | Admit: 2017-03-23 | Payer: Medicare (Managed Care) | Source: Ambulatory Visit

## 2017-04-01 ENCOUNTER — Other Ambulatory Visit (INDEPENDENT_AMBULATORY_CARE_PROVIDER_SITE_OTHER): Payer: Self-pay | Admitting: Family

## 2017-04-11 NOTE — Progress Notes (Deleted)
Cardiology Office Note    Date:  04/11/2017   ID:  Samantha Levine, DOB 01-05-1956, MRN [id number]  PCP:  Jethro BastosKoehler, Robert N, MD  Cardiologist:  New  Chief Complaint: surgical clearance  History of Present Illness:   Samantha Levine is a 61 y.o. female female HTN, HLD, DM with peripheral neuropathy, GERD, fibromyalgia, depression, OSA not on CPAP and chronic left maxillary sinusitis  Presents for   Seen by me initially 09/29/16 for cardiac Clearance for left maxillary sinus surgery possibly associated with dental abscess and plan for inpatinet surgery due to OSA requiring CPAP. She complains of intermittent chest tightness with and without exertional. Also had orthopnea and PND. After discussion with DOD (Dr. Ladona Ridgelaylor) ecommended echo and stress test prior to surgical clearance. However never placed. BNP normal. Started on HCTZ 25mg  qd for elevated blood pressure.   Here today for ***   Past Medical History:  Diagnosis Date  . Allergic rhinitis   . Cervical spondylosis with radiculopathy   . Chronic pain   . Conjunctivitis   . Fibromyalgia   . GERD (gastroesophageal reflux disease)   . H/O peripheral neuropathy   . Hypercholesterolemia   . Hypertension   . Keratitis sicca   . Left maxillary sinusitis    RELATED TO DENTAL ISSUES  . Left-sided weakness    POSSIBLE RELATED TO CONVERSION DISORDER  . Major depression    RECURRENT AND SEVERE WITH GRIEF REACTION AND EPISODES OF CONVERSION REACTION MANIFESTED BY LEFT-SIDED WEAKNESS AND LOSS OF SENSATION  . Morbid obesity (HCC)   . Osteoarthritis of knee    SPINE, ANKLES AND FEET  . Type 2 diabetes mellitus with diabetic nephropathy (HCC)   . Vitamin D deficiency     Past Surgical History:  Procedure Laterality Date  . CESAREAN SECTION    . CYST REMOVAL HAND     x2  . TUBAL LIGATION      Current Medications: Prior to Admission medications   Medication Sig Start Date End Date Taking? Authorizing Provider  acetaminophen  (TYLENOL) 500 MG tablet Take 1,000 mg by mouth 2 (two) times daily as needed.    [provider]  albuterol (PROVENTIL HFA;VENTOLIN HFA) 108 (90 Base) MCG/ACT inhaler Inhale 2 puffs into the lungs 4 (four) times daily as needed for wheezing or shortness of breath.    [provider]  ALPRAZolam Prudy Feeler(XANAX) 0.5 MG tablet Take 0.5 mg by mouth 2 (two) times daily as needed for anxiety.    [provider]  amitriptyline (ELAVIL) 25 MG tablet Take 75 mg by mouth at bedtime.    [provider]  amLODipine (NORVASC) 10 MG tablet Take 10 mg by mouth daily.    [provider]  aspirin EC 81 MG tablet Take 81 mg by mouth daily.    [provider]  atorvastatin (LIPITOR) 40 MG tablet Take 40 mg by mouth daily at 6 PM.    [provider]  Cholecalciferol (VITAMIN D3) 1000 units CAPS Take 1 capsule by mouth daily.    [provider]  cycloSPORINE (RESTASIS) 0.05 % ophthalmic emulsion Place 1 drop into both eyes daily. DRY EYES    [provider]  fluticasone (FLONASE) 50 MCG/ACT nasal spray Place 1 spray into both nostrils daily.    [provider]  Hypromellose (ARTIFICIAL TEARS OP) Apply 1 drop to eye every 2 (two) hours as needed. FOR DRY EYES    [provider]  ibuprofen (ADVIL,MOTRIN) 800 MG tablet TAKE  1 TABLET BY MOUTH TWICE DAILY 04/02/17   Adonis Huguenin, NP  losartan (COZAAR) 100 MG tablet Take 1 tablet (100 mg total) by mouth daily. 10/06/16   Courteney Alderete, Sharrell Ku, PA  metFORMIN (GLUCOPHAGE) 500 MG tablet Take by mouth 2 (two) times daily with a meal.    [provider]  Olopatadine HCl (PATADAY) 0.2 % SOLN Apply 1 drop to eye daily as needed.    [provider]  pantoprazole (PROTONIX) 40 MG tablet Take 40 mg by mouth 2 (two) times daily.    [provider]  polyethylene glycol powder (MIRALAX) powder Take 1 Container by mouth daily.    [provider]  predniSONE  (DELTASONE) 10 MG tablet Take 2 tablets (20 mg total) by mouth daily with breakfast. 01/13/17   Nadara Mustard, MD    Allergies:   Tramadol; Augmentin [amoxicillin-pot clavulanate]; Codeine; Doxycycline; Lisinopril; and Darvon [propoxyphene]   Social History   Social History  . Marital status: Married    Spouse name: N/A  . Number of children: 5  . Years of education: COLLEGE   Occupational History  . DISABLED    Social History Main Topics  . Smoking status: Former Games developer  . Smokeless tobacco: Never Used  . Alcohol use No  . Drug use: No  . Sexual activity: Not on file   Other Topics Concern  . Not on file   Social History Narrative  . No narrative on file     Family History:  The patient's family history includes Congestive Heart Failure in her brother and father. ***  ROS:   Please see the history of present illness.    ROS All other systems reviewed and are negative.   PHYSICAL EXAM:   VS:  There were no vitals taken for this visit.   GEN: Well nourished, well developed, in no acute distress  HEENT: normal  Neck: no JVD, carotid bruits, or masses Cardiac: ***RRR; no murmurs, rubs, or gallops,no edema  Respiratory:  clear to auscultation bilaterally, normal work of breathing GI: soft, nontender, nondistended, + BS MS: no deformity or atrophy  Skin: warm and dry, no rash Neuro:  Alert and Oriented x 3, Strength and sensation are intact Psych: euthymic mood, full affect  Wt Readings from Last 3 Encounters:  03/04/17 243 lb (110.2 kg)  09/29/16 243 lb 6.4 oz (110.4 kg)  05/13/15 240 lb (108.9 kg)      Studies/Labs Reviewed:   EKG:  EKG is ordered today.  The ekg ordered today demonstrates ***  Recent Labs: 09/29/2016: Brain Natriuretic Peptide 48.3   Lipid Panel No results found for: CHOL, TRIG, HDL, CHOLHDL, VLDL, LDLCALC, LDLDIRECT  Additional studies/ records that were reviewed today include:   As above    ASSESSMENT & PLAN:     1. ***    Medication Adjustments/Labs and Tests Ordered: Current medicines are reviewed at length with the patient today.  Concerns regarding medicines are outlined above.  Medication changes, Labs and Tests ordered today are listed in the Patient Instructions below. There are no Patient Instructions on file for this visit.   Lorelei Pont, [place]  04/11/2017 12:54 PM    [facility] [address] [place], [place], [place]  [address] Phone: [phone]; Fax: [phone]

## 2017-04-13 ENCOUNTER — Ambulatory Visit: Payer: Medicare (Managed Care) | Admitting: Physician Assistant

## 2017-04-14 ENCOUNTER — Encounter: Payer: Self-pay | Admitting: Physician Assistant

## 2018-02-23 ENCOUNTER — Other Ambulatory Visit (INDEPENDENT_AMBULATORY_CARE_PROVIDER_SITE_OTHER): Payer: Self-pay | Admitting: Family

## 2018-08-11 ENCOUNTER — Other Ambulatory Visit: Payer: Self-pay | Admitting: Family Medicine

## 2018-08-11 DIAGNOSIS — Z1231 Encounter for screening mammogram for malignant neoplasm of breast: Secondary | ICD-10-CM

## 2018-09-09 ENCOUNTER — Ambulatory Visit: Payer: Medicare (Managed Care)

## 2018-10-17 ENCOUNTER — Ambulatory Visit
Admission: RE | Admit: 2018-10-17 | Discharge: 2018-10-17 | Disposition: A | Discharge status: Home / Self Care | Payer: Medicare (Managed Care) | Source: Ambulatory Visit | Attending: Family Medicine | Admitting: Family Medicine

## 2018-10-17 DIAGNOSIS — Z1231 Encounter for screening mammogram for malignant neoplasm of breast: Secondary | ICD-10-CM

## 2019-10-17 IMAGING — MG DIGITAL SCREENING BILATERAL MAMMOGRAM WITH CAD
4 series · 4 of 4 positions shown · non-contrast
Comparison: Previous exam(s).

CLINICAL DATA: Screening.

EXAM:
DIGITAL SCREENING BILATERAL MAMMOGRAM WITH CAD

[R CC]
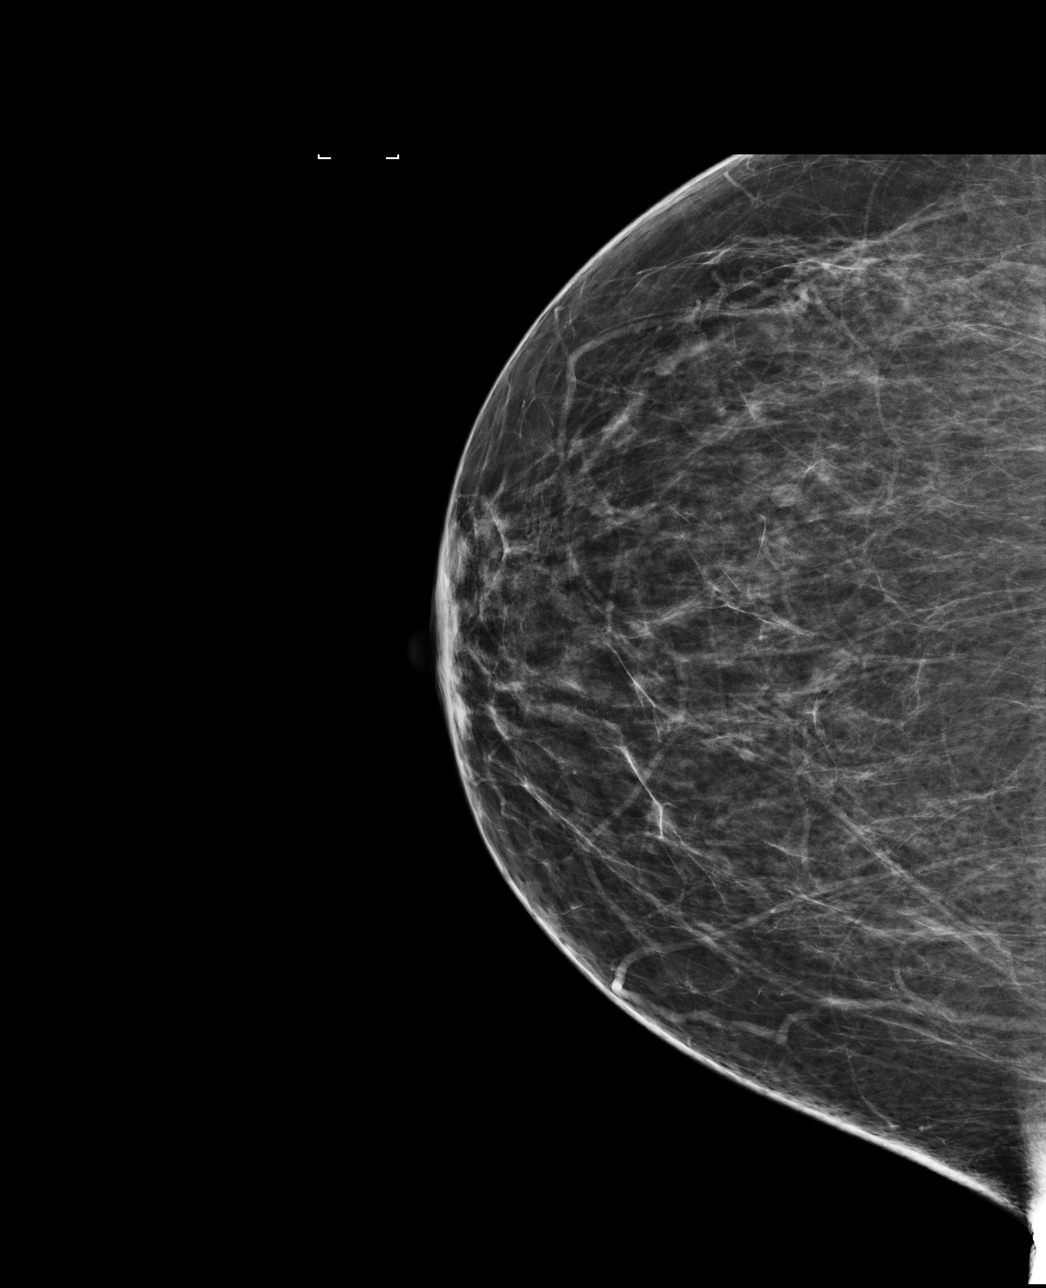

[L MLO]
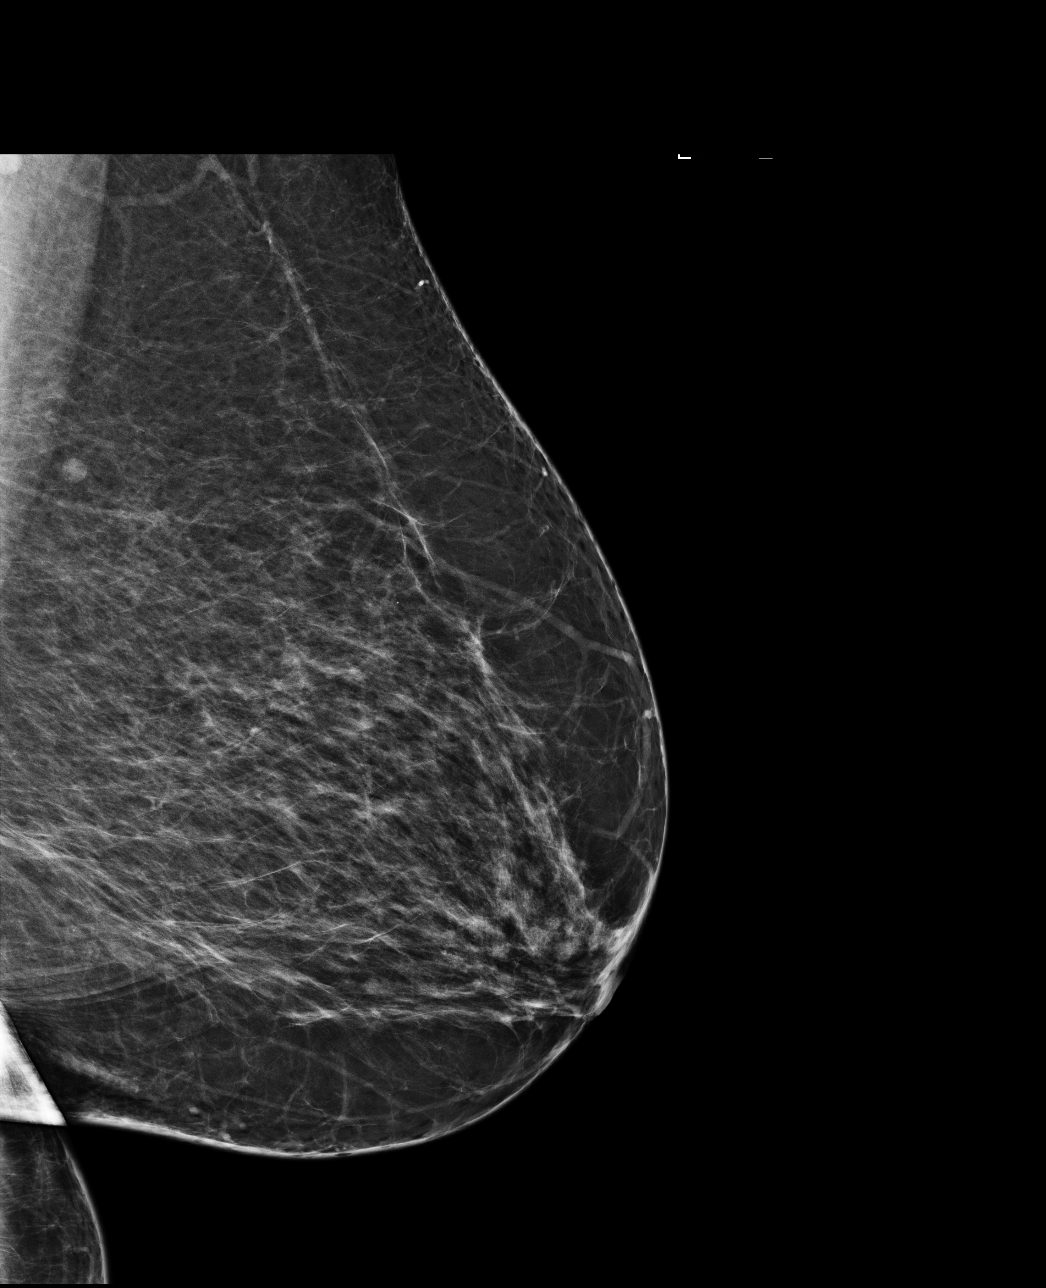

[L CC]
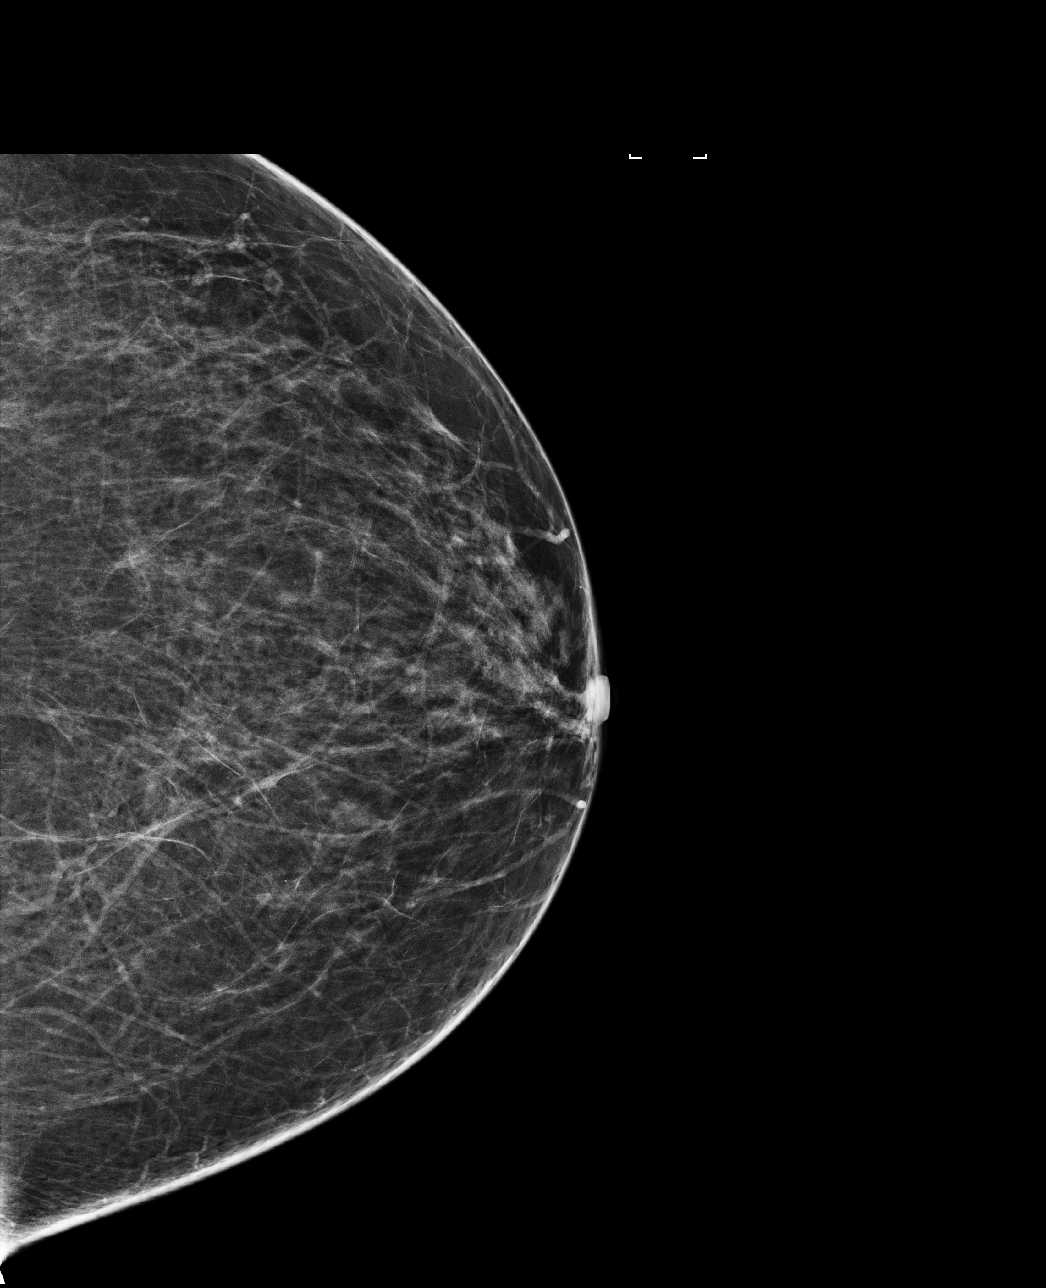

[R MLO]
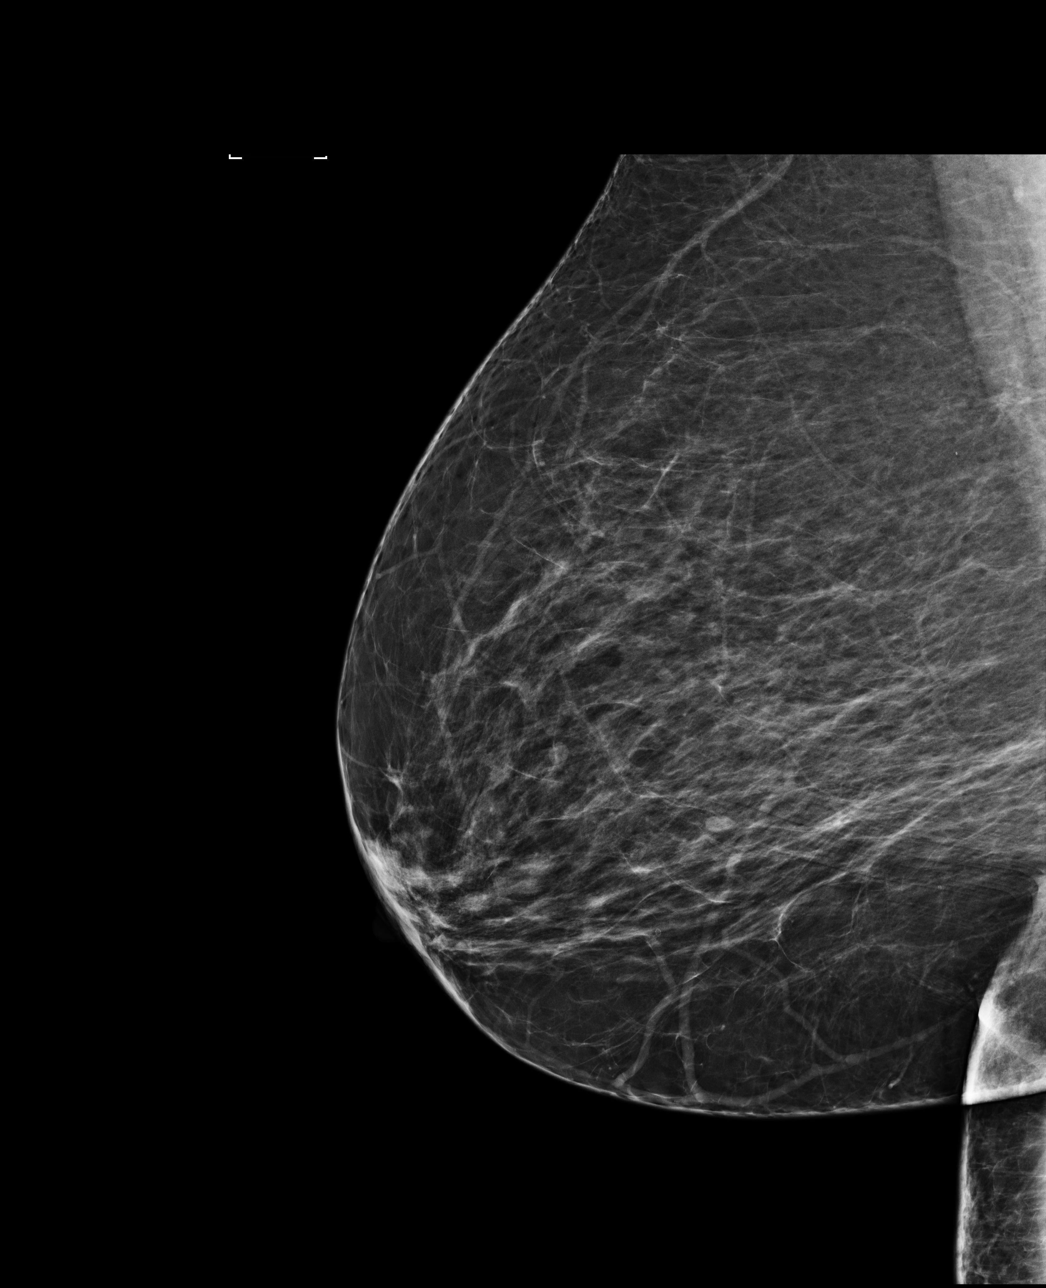

[4 of 4 positions shown; findings below may reference images not displayed]

ACR Breast Density Category b: There are scattered areas of
fibroglandular density.
FINDINGS: There are no findings suspicious for malignancy. Images were
processed with CAD.
IMPRESSION: No mammographic evidence of malignancy. A result letter of this
screening mammogram will be mailed directly to the patient.

RECOMMENDATION:
Screening mammogram in one year. (Code:AS-G-LCT)

BI-RADS CATEGORY  1: Negative.

## 2020-01-30 ENCOUNTER — Other Ambulatory Visit: Payer: Self-pay | Admitting: Internal Medicine

## 2020-01-30 DIAGNOSIS — M797 Fibromyalgia: Secondary | ICD-10-CM

## 2020-01-30 DIAGNOSIS — G8929 Other chronic pain: Secondary | ICD-10-CM

## 2020-01-30 DIAGNOSIS — M5412 Radiculopathy, cervical region: Secondary | ICD-10-CM

## 2020-02-22 ENCOUNTER — Inpatient Hospital Stay: Admission: RE | Admit: 2020-02-22 | Payer: Medicare (Managed Care) | Source: Ambulatory Visit

## 2020-02-22 ENCOUNTER — Other Ambulatory Visit: Payer: Medicare (Managed Care)

## 2022-05-25 ENCOUNTER — Emergency Department (HOSPITAL_BASED_OUTPATIENT_CLINIC_OR_DEPARTMENT_OTHER): Payer: Medicare Other

## 2022-05-25 ENCOUNTER — Encounter (HOSPITAL_BASED_OUTPATIENT_CLINIC_OR_DEPARTMENT_OTHER): Payer: Self-pay | Admitting: Pediatrics

## 2022-05-25 ENCOUNTER — Emergency Department (HOSPITAL_BASED_OUTPATIENT_CLINIC_OR_DEPARTMENT_OTHER)
Admission: EM | Admit: 2022-05-25 | Discharge: 2022-05-25 | Disposition: A | Discharge status: Home / Self Care | Payer: Medicare Other | Attending: Emergency Medicine | Admitting: Emergency Medicine

## 2022-05-25 ENCOUNTER — Other Ambulatory Visit: Payer: Self-pay

## 2022-05-25 DIAGNOSIS — M545 Low back pain, unspecified: Secondary | ICD-10-CM | POA: Diagnosis present

## 2022-05-25 DIAGNOSIS — I7143 Infrarenal abdominal aortic aneurysm, without rupture: Secondary | ICD-10-CM

## 2022-05-25 DIAGNOSIS — R079 Chest pain, unspecified: Secondary | ICD-10-CM | POA: Diagnosis not present

## 2022-05-25 DIAGNOSIS — Z7982 Long term (current) use of aspirin: Secondary | ICD-10-CM | POA: Diagnosis not present

## 2022-05-25 LAB — URINALYSIS, ROUTINE W REFLEX MICROSCOPIC
Bilirubin Urine: NEGATIVE
Glucose, UA: NEGATIVE mg/dL
Hgb urine dipstick: NEGATIVE
Ketones, ur: 15 mg/dL — AB
Leukocytes,Ua: NEGATIVE
Nitrite: NEGATIVE
Protein, ur: NEGATIVE mg/dL
Specific Gravity, Urine: 1.02 (ref 1.005–1.030)
pH: 7 (ref 5.0–8.0)

## 2022-05-25 LAB — BASIC METABOLIC PANEL
Anion gap: 12 (ref 5–15)
BUN: 15 mg/dL (ref 8–23)
CO2: 23 mmol/L (ref 22–32)
Calcium: 9.8 mg/dL (ref 8.9–10.3)
Chloride: 102 mmol/L (ref 98–111)
Creatinine, Ser: 0.83 mg/dL (ref 0.44–1.00)
GFR, Estimated: >60 mL/min (ref >60)
Glucose, Bld: 136 mg/dL — ABNORMAL HIGH (ref 70–99)
Potassium: 4.1 mmol/L (ref 3.5–5.1)
Sodium: 137 mmol/L (ref 135–145)

## 2022-05-25 LAB — CBC
HCT: 41.9 % (ref 36.0–46.0)
Hemoglobin: 14 g/dL (ref 12.0–15.0)
MCH: 28.8 pg (ref 26.0–34.0)
MCHC: 33.4 g/dL (ref 30.0–36.0)
MCV: 86.2 fL (ref 80.0–100.0)
Platelets: 177 10*3/uL (ref 150–400)
RBC: 4.86 MIL/uL (ref 3.87–5.11)
RDW: 12.8 % (ref 11.5–15.5)
WBC: 6.9 10*3/uL (ref 4.0–10.5)
nRBC: 0 % (ref 0.0–0.2)

## 2022-05-25 LAB — TROPONIN I (HIGH SENSITIVITY)
Troponin I (High Sensitivity): 5 ng/L (ref <18)
Troponin I (High Sensitivity): 5 ng/L (ref <18)

## 2022-05-25 MED ORDER — IOHEXOL 350 MG/ML SOLN
100.0000 mL | Freq: Once | INTRAVENOUS | Status: AC | PRN
Start: 2022-05-25 — End: 2022-05-25
  Administered 2022-05-25: 100 mL via INTRAVENOUS

## 2022-05-25 MED ORDER — HYDROCODONE-ACETAMINOPHEN 5-325 MG PO TABS
1.0000 | ORAL_TABLET | Freq: Once | ORAL | Status: AC
  Administered 2022-05-25: 1 via ORAL
  Filled 2022-05-25: qty 1

## 2022-05-25 MED ORDER — SENNOSIDES-DOCUSATE SODIUM 8.6-50 MG PO TABS
1.0000 | ORAL_TABLET | Freq: Every evening | ORAL | 0 refills | Status: AC | PRN
Start: 2022-05-25 — End: ?

## 2022-05-25 MED ORDER — MORPHINE SULFATE (PF) 4 MG/ML IV SOLN
4.0000 mg | Freq: Once | INTRAVENOUS | Status: AC
  Administered 2022-05-25: 4 mg via INTRAVENOUS
  Filled 2022-05-25: qty 1

## 2022-05-25 MED ORDER — OXYCODONE-ACETAMINOPHEN 5-325 MG PO TABS: 1.0000 | ORAL_TABLET | ORAL | Status: DC | PRN

## 2022-05-25 MED ORDER — ONDANSETRON HCL 4 MG/2ML IJ SOLN
4.0000 mg | Freq: Once | INTRAMUSCULAR | Status: AC
  Administered 2022-05-25: 4 mg via INTRAVENOUS
  Filled 2022-05-25: qty 2

## 2022-05-25 MED ORDER — CYCLOBENZAPRINE HCL 10 MG PO TABS: 10.0000 mg | ORAL_TABLET | Freq: Three times a day (TID) | ORAL | 0 refills | Status: AC | PRN

## 2022-05-25 MED ORDER — HYDROCODONE-ACETAMINOPHEN 5-325 MG PO TABS: 2.0000 | ORAL_TABLET | Freq: Four times a day (QID) | ORAL | 0 refills | Status: AC | PRN

## 2022-05-25 NOTE — Discharge Instructions (Signed)
You were seen in the emergency room today with chest, back, abdominal discomfort.  Your CT scan did not show any findings to explain your symptoms.  I suspect this may be coming from your back and have called in some pain medications.  This can cause lightheadedness.  You cannot take the Flexeril and Vicodin at the same time.  Them to space this out by several hours.  Please follow closely with your cardiology doctors as well as your spine doctors.  Please tell your primary care doctor about the slight dilation in your abdominal blood vessel the aorta.  This will need follow-up scan in 5 years to make sure its not getting any larger.

## 2022-05-25 NOTE — ED Triage Notes (Signed)
C/O left sided chest pain and right plank pain started while getting some imaging on legs to rule out blood clots;

## 2022-05-25 NOTE — ED Notes (Signed)
ED Provider at bedside. 

## 2022-05-25 NOTE — ED Provider Notes (Signed)
[facility] EMERGENCY DEPARTMENT Provider Note   CSN: [id number] Arrival date & time: 05/25/22  1254     History  Chief Complaint  Patient presents with   Chest Pain    Samantha Levine is a 66 y.o. female.  Patient presents to ER chief complaint of right-sided lower back pain and chest pain.  She states symptoms started earlier today.  She was at rest getting ultrasounds of her legs done at a [facility] when symptoms started.  She describes it as sharp and severe in persistent.  Denies any recent fall or trauma no fever no cough no vomiting or diarrhea.  She has a history of chronic back pain but she states that she thinks this feels different.       Home Medications Prior to Admission medications   Medication Sig Start Date End Date Taking? Authorizing Provider  acetaminophen (TYLENOL) 500 MG tablet Take 1,000 mg by mouth 2 (two) times daily as needed.    [provider]  albuterol (PROVENTIL HFA;VENTOLIN HFA) 108 (90 Base) MCG/ACT inhaler Inhale 2 puffs into the lungs 4 (four) times daily as needed for wheezing or shortness of breath.    [provider]  ALPRAZolam Prudy Feeler) 0.5 MG tablet Take 0.5 mg by mouth 2 (two) times daily as needed for anxiety.    [provider]  amitriptyline (ELAVIL) 25 MG tablet Take 75 mg by mouth at bedtime.    [provider]  amLODipine (NORVASC) 10 MG tablet Take 10 mg by mouth daily.    [provider]  aspirin EC 81 MG tablet Take 81 mg by mouth daily.    [provider]  atorvastatin (LIPITOR) 40 MG tablet Take 40 mg by mouth daily at 6 PM.    [provider]  Cholecalciferol (VITAMIN D3) 1000 units CAPS Take 1 capsule by mouth daily.    [provider]  cycloSPORINE (RESTASIS) 0.05 % ophthalmic emulsion Place 1 drop into both eyes daily. DRY EYES    [provider]  fluticasone (FLONASE) 50 MCG/ACT nasal spray Place 1 spray into both nostrils  daily.    [provider]  Hypromellose (ARTIFICIAL TEARS OP) Apply 1 drop to eye every 2 (two) hours as needed. FOR DRY EYES    [provider]  ibuprofen (ADVIL,MOTRIN) 800 MG tablet TAKE 1 TABLET BY MOUTH TWICE DAILY 04/02/17   Adonis Huguenin, NP  losartan (COZAAR) 100 MG tablet Take 1 tablet (100 mg total) by mouth daily. 10/06/16   Bhagat, Sharrell Ku, PA  metFORMIN (GLUCOPHAGE) 500 MG tablet Take by mouth 2 (two) times daily with a meal.    [provider]  Olopatadine HCl (PATADAY) 0.2 % SOLN Apply 1 drop to eye daily as needed.    [provider]  pantoprazole (PROTONIX) 40 MG tablet Take 40 mg by mouth 2 (two) times daily.    [provider]  polyethylene glycol powder (MIRALAX) powder Take 1 Container by mouth daily.    [provider]  predniSONE (DELTASONE) 10 MG tablet Take 2 tablets (20 mg total) by mouth daily with breakfast. 01/13/17   Nadara Mustard, MD      Allergies    Tramadol, Augmentin [amoxicillin-pot clavulanate], Codeine, Doxycycline, Lisinopril, and Darvon [propoxyphene]    Review of Systems   Review of Systems  Constitutional:  Negative for fever.  HENT:  Negative for ear pain.   Eyes:  Negative for pain.  Respiratory:  Negative for cough.  Cardiovascular:  Positive for chest pain.  Gastrointestinal:  Negative for abdominal pain.  Genitourinary:  Negative for flank pain.  Musculoskeletal:  Positive for back pain.  Skin:  Negative for rash.  Neurological:  Negative for headaches.    Physical Exam Updated Vital Signs BP 140/74   Pulse (!) 46   Temp 97.7 F (36.5 C) (Oral)   Resp 14   Ht 5\' 6"  (1.676 m)   Wt 120.2 kg   SpO2 100%   BMI 42.77 kg/m  Physical Exam Constitutional:      Appearance: Normal appearance.     Comments: Appears in pain  HENT:     Head: Normocephalic.     Nose: Nose normal.  Eyes:     Extraocular Movements: Extraocular movements intact.  Cardiovascular:     Rate and  Rhythm: Normal rate.  Pulmonary:     Effort: Pulmonary effort is normal.  Musculoskeletal:     Cervical back: Normal range of motion.     Comments: No C or T spine tenderness.  Diffuse L-spine tenderness noted.  Neurological:     General: No focal deficit present.     Mental Status: She is alert. Mental status is at baseline.     Comments: 5/5 strength all extremities.     ED Results / Procedures / Treatments   Labs (all labs ordered are listed, but only abnormal results are displayed) Labs Reviewed  BASIC METABOLIC PANEL - Abnormal; Notable for the following components:      Result Value   Glucose, Bld 136 (*)    All other components within normal limits  URINALYSIS, ROUTINE W REFLEX MICROSCOPIC - Abnormal; Notable for the following components:   Ketones, ur 15 (*)    All other components within normal limits  CBC  TROPONIN I (HIGH SENSITIVITY)  TROPONIN I (HIGH SENSITIVITY)    EKG EKG Interpretation  Date/Time:  Monday May 25 2022 13:10:05 EDT Ventricular Rate:  56 PR Interval:  146 QRS Duration: 122 QT Interval:  444 QTC Calculation: 428 R Axis:   11 Text Interpretation: Sinus bradycardia Right bundle branch block Abnormal ECG No previous ECGs available Confirmed by [id number] ([id number]) on 05/25/2022 1:19:17 PM  Radiology DG Chest 2 View  Result Date: 05/25/2022 CLINICAL DATA:  Left-sided chest pain.  Right-sided flank pain. EXAM: CHEST - 2 VIEW COMPARISON:  12/15/2016 FINDINGS: Artifact overlies the chest. Heart size is normal. Ordinary aortic atherosclerotic calcification is noted. The pulmonary vascularity is normal. The lungs are clear. No effusions. Ordinary thoracic degenerative changes incidentally noted. IMPRESSION: No active cardiopulmonary disease. Electronically Signed   By: 12/17/2016 M.D.   On: 05/25/2022 13:57    Procedures Procedures    Medications Ordered in ED Medications  morphine (PF) 4 MG/ML injection 4 mg (4 mg Intravenous Given 05/25/22 1347)   ondansetron (ZOFRAN) injection 4 mg (4 mg Intravenous Given 05/25/22 1346)  iohexol (OMNIPAQUE) 350 MG/ML injection 100 mL (100 mLs Intravenous Contrast Given 05/25/22 1450)    ED Course/ Medical Decision Making/ A&P                           Medical Decision Making Amount and/or Complexity of Data Reviewed Labs: ordered. Radiology: ordered.  Risk Prescription drug management.   Chart review shows outpatient visit yesterday and discharged home.  Cardiac monitoring showing sinus rhythm.  History from family at bedside  Diagnosis studies were sent.  Troponin initially is negative chemistry white  count normal hemoglobin normal.  Repeat troponin pending.  Patient continues to complain of pain although much improved.  CT angio pursued given involvement of both back and chest at the same time.  This result is pending.  Patient will be signed out to oncoming provider.        Final Clinical Impression(s) / ED Diagnoses Final diagnoses:  Nonspecific chest pain  Right-sided low back pain without sciatica, unspecified chronicity    Rx / DC Orders ED Discharge Orders     None         Cheryll Cockayne, MD 05/25/22 1458

## 2022-05-25 NOTE — ED Notes (Signed)
Patient transported to CT 

## 2022-05-25 NOTE — ED Provider Notes (Signed)
Blood pressure 133/71, pulse (!) 45, temperature 97.7 F (36.5 C), temperature source Oral, resp. rate 12, height 5\' 6"  (1.676 m), weight 120.2 kg, SpO2 99 %.  Assuming care from Dr. .  In short, Samantha Levine is a 66 y.o. female with a chief complaint of Chest Pain .  Refer to the original H&P for additional details.  The current plan of care is to follow-up on imaging.   CTA dissection independently interpreted and agree with radiology interpretation. Meds ordered to pharmacy. Discussed incidental findings on CT and listed in the AVS.       Samantha Levine, 79, MD 05/25/22 2325

## 2022-05-25 NOTE — ED Notes (Signed)
Patient transported to X-ray
